# Patient Record
Sex: Male | Born: 1950 | Race: White | Hispanic: No | Marital: Married | Smoking: Former smoker
Health system: Southern US, Community
[De-identification: ages and names within clinical notes are randomized; demographics above are authoritative.]

## PROBLEM LIST (undated history)

## (undated) DIAGNOSIS — M1990 Unspecified osteoarthritis, unspecified site: Secondary | ICD-10-CM

## (undated) DIAGNOSIS — K219 Gastro-esophageal reflux disease without esophagitis: Secondary | ICD-10-CM

## (undated) DIAGNOSIS — F32A Depression, unspecified: Secondary | ICD-10-CM

## (undated) DIAGNOSIS — Z973 Presence of spectacles and contact lenses: Secondary | ICD-10-CM

## (undated) DIAGNOSIS — F329 Major depressive disorder, single episode, unspecified: Secondary | ICD-10-CM

## (undated) DIAGNOSIS — E785 Hyperlipidemia, unspecified: Secondary | ICD-10-CM

## (undated) HISTORY — PX: COLONOSCOPY: SHX174

## (undated) HISTORY — PX: TONSILLECTOMY: SUR1361

## (undated) HISTORY — PX: WISDOM TOOTH EXTRACTION: SHX21

---

## 2004-11-22 ENCOUNTER — Ambulatory Visit (HOSPITAL_COMMUNITY): Admission: RE | Admit: 2004-11-22 | Discharge: 2004-11-22 | Payer: Self-pay | Admitting: Gastroenterology

## 2005-09-03 ENCOUNTER — Encounter: Admission: RE | Admit: 2005-09-03 | Discharge: 2005-12-02 | Payer: Self-pay | Admitting: Internal Medicine

## 2014-07-05 ENCOUNTER — Encounter (HOSPITAL_BASED_OUTPATIENT_CLINIC_OR_DEPARTMENT_OTHER): Payer: Self-pay | Admitting: *Deleted

## 2014-07-05 NOTE — Progress Notes (Signed)
No labs needed

## 2014-07-06 ENCOUNTER — Other Ambulatory Visit: Payer: Self-pay | Admitting: Physician Assistant

## 2014-07-06 NOTE — H&P (Signed)
Kevin Porter is an 63 y.o. male.   Chief Complaint: right knee medial meniscus tear HPI: Kevin Porter is a 63-year-old patient seen in our outpatient office for evaluation of right knee problems that began on 05/11/2014.  He was stretching prior to a golf match when he felt something give anterior and medial in his right knee.  He felt as if something had separated.  He was unable to continue the golf match.  Since then, he has had persistent pain mostly with bending the knee or twisting.  It still feels that something comes and goes out of place.  He has had some mild swelling.  No locking.  We elected to obtain MRI which showed medial meniscus tear.    Past Medical History  Diagnosis Date  . Hyperlipemia   . GERD (gastroesophageal reflux disease)   . Arthritis   . Depression   . Wears glasses     Past Surgical History  Procedure Laterality Date  . Tonsillectomy    . Wisdom tooth extraction    . Colonoscopy      No family history on file. Social History:  reports that he quit smoking about 30 years ago. He does not have any smokeless tobacco history on file. He reports that he drinks alcohol. His drug history is not on file.  Allergies: No Known Allergies   (Not in a hospital admission)  No results found for this or any previous visit (from the past 48 hour(s)). No results found.  Review of Systems  Constitutional: Negative.   HENT: Negative for congestion, ear discharge, ear pain, hearing loss, nosebleeds, sore throat and tinnitus.   Eyes: Negative.   Respiratory: Negative.  Negative for stridor.   Cardiovascular: Negative.   Gastrointestinal: Negative.   Genitourinary: Negative.   Musculoskeletal: Positive for joint pain.  Skin: Negative.   Neurological: Positive for headaches. Negative for dizziness, tingling, tremors, sensory change, speech change, focal weakness, seizures and loss of consciousness.  Endo/Heme/Allergies: Negative.   Psychiatric/Behavioral: Positive  for depression. Negative for suicidal ideas, hallucinations, memory loss and substance abuse. The patient is not nervous/anxious and does not have insomnia.     There were no vitals taken for this visit. Physical Exam  Constitutional: He is oriented to person, place, and time. He appears well-developed and well-nourished. No distress.  HENT:  Head: Normocephalic and atraumatic.  Eyes: Conjunctivae and EOM are normal.  Neck: Normal range of motion. Neck supple.  Cardiovascular: Normal rate, regular rhythm and intact distal pulses.   Respiratory: Effort normal. No respiratory distress.  GI: Soft. He exhibits no distension. There is no tenderness.  Musculoskeletal:       Right knee: He exhibits swelling and bony tenderness. He exhibits no ecchymosis, no deformity, no laceration and no erythema. Tenderness found. Medial joint line tenderness noted.  Neurological: He is alert and oriented to person, place, and time.  Skin: Skin is warm and dry. No rash noted. No erythema.  Psychiatric: He has a normal mood and affect. His behavior is normal.     Assessment/Plan Right knee medial meniscus tear  We discussed risks and benefits of right knee arthroscopy medial menisectomy, possible debridement chondroplasty patient wishes to proceed.  This will be set up as outpatient surgery.  Oliviya Gilkison 07/06/2014, 5:24 PM    

## 2014-07-07 ENCOUNTER — Encounter (HOSPITAL_BASED_OUTPATIENT_CLINIC_OR_DEPARTMENT_OTHER)
Admission: RE | Disposition: A | Discharge status: Home / Self Care | Payer: Self-pay | Source: Ambulatory Visit | Attending: Orthopedic Surgery

## 2014-07-07 ENCOUNTER — Encounter (HOSPITAL_BASED_OUTPATIENT_CLINIC_OR_DEPARTMENT_OTHER): Payer: 59 | Admitting: Certified Registered"

## 2014-07-07 ENCOUNTER — Ambulatory Visit (HOSPITAL_BASED_OUTPATIENT_CLINIC_OR_DEPARTMENT_OTHER)
Admission: RE | Admit: 2014-07-07 | Discharge: 2014-07-07 | Disposition: A | Discharge status: Home / Self Care | Payer: 59 | Source: Ambulatory Visit | Attending: Orthopedic Surgery | Admitting: Orthopedic Surgery

## 2014-07-07 ENCOUNTER — Ambulatory Visit (HOSPITAL_BASED_OUTPATIENT_CLINIC_OR_DEPARTMENT_OTHER): Payer: 59 | Admitting: Certified Registered"

## 2014-07-07 ENCOUNTER — Encounter (HOSPITAL_BASED_OUTPATIENT_CLINIC_OR_DEPARTMENT_OTHER): Payer: Self-pay | Admitting: Certified Registered"

## 2014-07-07 DIAGNOSIS — M129 Arthropathy, unspecified: Secondary | ICD-10-CM | POA: Insufficient documentation

## 2014-07-07 DIAGNOSIS — IMO0002 Reserved for concepts with insufficient information to code with codable children: Secondary | ICD-10-CM | POA: Insufficient documentation

## 2014-07-07 DIAGNOSIS — K219 Gastro-esophageal reflux disease without esophagitis: Secondary | ICD-10-CM | POA: Insufficient documentation

## 2014-07-07 DIAGNOSIS — X500XXA Overexertion from strenuous movement or load, initial encounter: Secondary | ICD-10-CM | POA: Insufficient documentation

## 2014-07-07 DIAGNOSIS — F329 Major depressive disorder, single episode, unspecified: Secondary | ICD-10-CM | POA: Insufficient documentation

## 2014-07-07 DIAGNOSIS — Z87891 Personal history of nicotine dependence: Secondary | ICD-10-CM | POA: Insufficient documentation

## 2014-07-07 DIAGNOSIS — Y9353 Activity, golf: Secondary | ICD-10-CM | POA: Insufficient documentation

## 2014-07-07 DIAGNOSIS — F3289 Other specified depressive episodes: Secondary | ICD-10-CM | POA: Insufficient documentation

## 2014-07-07 DIAGNOSIS — E785 Hyperlipidemia, unspecified: Secondary | ICD-10-CM | POA: Insufficient documentation

## 2014-07-07 DIAGNOSIS — F1010 Alcohol abuse, uncomplicated: Secondary | ICD-10-CM | POA: Insufficient documentation

## 2014-07-07 HISTORY — DX: Presence of spectacles and contact lenses: Z97.3

## 2014-07-07 HISTORY — DX: Major depressive disorder, single episode, unspecified: F32.9

## 2014-07-07 HISTORY — DX: Unspecified osteoarthritis, unspecified site: M19.90

## 2014-07-07 HISTORY — DX: Hyperlipidemia, unspecified: E78.5

## 2014-07-07 HISTORY — DX: Depression, unspecified: F32.A

## 2014-07-07 HISTORY — PX: KNEE ARTHROSCOPY WITH MEDIAL MENISECTOMY: SHX5651

## 2014-07-07 HISTORY — DX: Gastro-esophageal reflux disease without esophagitis: K21.9

## 2014-07-07 LAB — POCT HEMOGLOBIN-HEMACUE: HEMOGLOBIN: 14.9 g/dL (ref 13.0–17.0)

## 2014-07-07 SURGERY — ARTHROSCOPY, KNEE, WITH MEDIAL MENISCECTOMY
Anesthesia: General | Site: Knee | Laterality: Right

## 2014-07-07 MED ORDER — BUPIVACAINE HCL 0.5 % IJ SOLN
INTRAMUSCULAR | Status: DC | PRN
  Administered 2014-07-07: 30 mL via INTRA_ARTICULAR

## 2014-07-07 MED ORDER — MORPHINE SULFATE 4 MG/ML IJ SOLN
INTRAMUSCULAR | Status: DC | PRN
  Administered 2014-07-07: 4 mg via INTRAVENOUS

## 2014-07-07 MED ORDER — BUPIVACAINE HCL (PF) 0.5 % IJ SOLN
INTRAMUSCULAR | Status: AC
  Filled 2014-07-07: qty 60

## 2014-07-07 MED ORDER — SODIUM CHLORIDE 0.9 % IR SOLN
Status: DC | PRN
  Administered 2014-07-07: 1

## 2014-07-07 MED ORDER — LIDOCAINE HCL (CARDIAC) 20 MG/ML IV SOLN
INTRAVENOUS | Status: DC | PRN
  Administered 2014-07-07: 30 mg via INTRAVENOUS

## 2014-07-07 MED ORDER — FENTANYL CITRATE 0.05 MG/ML IJ SOLN
INTRAMUSCULAR | Status: AC
  Filled 2014-07-07: qty 6

## 2014-07-07 MED ORDER — HYDROCODONE-ACETAMINOPHEN 7.5-325 MG PO TABS: 1.0000 | ORAL_TABLET | ORAL | Status: DC | PRN

## 2014-07-07 MED ORDER — EPINEPHRINE HCL 1 MG/ML IJ SOLN
INTRAMUSCULAR | Status: AC
  Filled 2014-07-07: qty 1

## 2014-07-07 MED ORDER — MIDAZOLAM HCL 2 MG/2ML IJ SOLN
INTRAMUSCULAR | Status: AC
  Filled 2014-07-07: qty 2

## 2014-07-07 MED ORDER — HYDROCODONE-ACETAMINOPHEN 5-325 MG PO TABS
1.0000 | ORAL_TABLET | Freq: Four times a day (QID) | ORAL | Status: DC | PRN
  Administered 2014-07-07: 1 via ORAL

## 2014-07-07 MED ORDER — ONDANSETRON HCL 4 MG/2ML IJ SOLN: 4.0000 mg | Freq: Four times a day (QID) | INTRAMUSCULAR | Status: DC | PRN

## 2014-07-07 MED ORDER — PROPOFOL 10 MG/ML IV BOLUS
INTRAVENOUS | Status: DC | PRN
  Administered 2014-07-07: 160 mg via INTRAVENOUS

## 2014-07-07 MED ORDER — SODIUM CHLORIDE 0.9 % IV SOLN: INTRAVENOUS | Status: DC

## 2014-07-07 MED ORDER — CHLORHEXIDINE GLUCONATE 4 % EX LIQD: 60.0000 mL | Freq: Once | CUTANEOUS | Status: DC

## 2014-07-07 MED ORDER — BUPIVACAINE-EPINEPHRINE (PF) 0.5% -1:200000 IJ SOLN
INTRAMUSCULAR | Status: AC
  Filled 2014-07-07: qty 30

## 2014-07-07 MED ORDER — HYDROMORPHONE HCL PF 1 MG/ML IJ SOLN: 0.5000 mg | INTRAMUSCULAR | Status: DC | PRN

## 2014-07-07 MED ORDER — FENTANYL CITRATE 0.05 MG/ML IJ SOLN
INTRAMUSCULAR | Status: DC | PRN
  Administered 2014-07-07: 50 ug via INTRAVENOUS

## 2014-07-07 MED ORDER — DEXAMETHASONE SODIUM PHOSPHATE 4 MG/ML IJ SOLN
INTRAMUSCULAR | Status: DC | PRN
  Administered 2014-07-07: 10 mg via INTRAVENOUS

## 2014-07-07 MED ORDER — LACTATED RINGERS IV SOLN
INTRAVENOUS | Status: DC
Start: 2014-07-07 — End: 2014-07-07
  Administered 2014-07-07 (×2): via INTRAVENOUS

## 2014-07-07 MED ORDER — HYDROCODONE-ACETAMINOPHEN 5-325 MG PO TABS
ORAL_TABLET | ORAL | Status: AC
  Filled 2014-07-07: qty 1

## 2014-07-07 MED ORDER — DOCUSATE SODIUM 100 MG PO CAPS: 100.0000 mg | ORAL_CAPSULE | Freq: Two times a day (BID) | ORAL | Status: DC

## 2014-07-07 MED ORDER — ONDANSETRON HCL 4 MG/2ML IJ SOLN
INTRAMUSCULAR | Status: DC | PRN
  Administered 2014-07-07: 4 mg via INTRAVENOUS

## 2014-07-07 MED ORDER — FENTANYL CITRATE 0.05 MG/ML IJ SOLN: 50.0000 ug | INTRAMUSCULAR | Status: DC | PRN

## 2014-07-07 MED ORDER — ONDANSETRON HCL 4 MG/2ML IJ SOLN: 4.0000 mg | Freq: Once | INTRAMUSCULAR | Status: DC | PRN

## 2014-07-07 MED ORDER — METOCLOPRAMIDE HCL 5 MG PO TABS: 5.0000 mg | ORAL_TABLET | Freq: Three times a day (TID) | ORAL | Status: DC | PRN

## 2014-07-07 MED ORDER — MORPHINE SULFATE 2 MG/ML IJ SOLN
INTRAMUSCULAR | Status: AC
  Filled 2014-07-07: qty 2

## 2014-07-07 MED ORDER — METHYLPREDNISOLONE ACETATE 80 MG/ML IJ SUSP
INTRAMUSCULAR | Status: AC
  Filled 2014-07-07: qty 1

## 2014-07-07 MED ORDER — HYDROMORPHONE HCL PF 1 MG/ML IJ SOLN
0.2500 mg | INTRAMUSCULAR | Status: DC | PRN
  Administered 2014-07-07: 11:00:00 via INTRAVENOUS
  Administered 2014-07-07: 0.5 mg via INTRAVENOUS

## 2014-07-07 MED ORDER — MIDAZOLAM HCL 2 MG/2ML IJ SOLN: 1.0000 mg | INTRAMUSCULAR | Status: DC | PRN

## 2014-07-07 MED ORDER — HYDROMORPHONE HCL PF 1 MG/ML IJ SOLN
INTRAMUSCULAR | Status: AC
  Filled 2014-07-07: qty 1

## 2014-07-07 MED ORDER — EPINEPHRINE HCL 1 MG/ML IJ SOLN
INTRAMUSCULAR | Status: DC | PRN
  Administered 2014-07-07: 1 mg via SUBCUTANEOUS

## 2014-07-07 MED ORDER — ONDANSETRON HCL 4 MG PO TABS: 4.0000 mg | ORAL_TABLET | Freq: Four times a day (QID) | ORAL | Status: DC | PRN

## 2014-07-07 MED ORDER — CEFAZOLIN SODIUM-DEXTROSE 2-3 GM-% IV SOLR
2.0000 g | INTRAVENOUS | Status: AC
  Administered 2014-07-07: 2 g via INTRAVENOUS

## 2014-07-07 MED ORDER — METOCLOPRAMIDE HCL 5 MG/ML IJ SOLN: 5.0000 mg | Freq: Three times a day (TID) | INTRAMUSCULAR | Status: DC | PRN

## 2014-07-07 MED ORDER — CEFAZOLIN SODIUM-DEXTROSE 2-3 GM-% IV SOLR
INTRAVENOUS | Status: AC
  Filled 2014-07-07: qty 50

## 2014-07-07 MED ORDER — MIDAZOLAM HCL 5 MG/5ML IJ SOLN
INTRAMUSCULAR | Status: DC | PRN
  Administered 2014-07-07: 2 mg via INTRAVENOUS

## 2014-07-07 MED ORDER — METHYLPREDNISOLONE ACETATE 40 MG/ML IJ SUSP
INTRAMUSCULAR | Status: AC
  Filled 2014-07-07: qty 1

## 2014-07-07 SURGICAL SUPPLY — 45 items
BANDAGE ELASTIC 6 VELCRO ST LF (GAUZE/BANDAGES/DRESSINGS) IMPLANT
BANDAGE ESMARK 6X9 LF (GAUZE/BANDAGES/DRESSINGS) IMPLANT
BLADE 4.2CUDA (BLADE) IMPLANT
BLADE CUDA 5.5 (BLADE) IMPLANT
BLADE CUDA GRT WHITE 3.5 (BLADE) ×2 IMPLANT
BLADE CUDA SHAVER 3.5 (BLADE) IMPLANT
BLADE CUTTER MENIS 5.5 (BLADE) IMPLANT
BLADE GREAT WHITE 4.2 (BLADE) IMPLANT
BLADE GREAT WHITE 4.2MM (BLADE)
BNDG CMPR 9X6 STRL LF SNTH (GAUZE/BANDAGES/DRESSINGS)
BNDG ESMARK 6X9 LF (GAUZE/BANDAGES/DRESSINGS)
BNDG GAUZE ELAST 4 BULKY (GAUZE/BANDAGES/DRESSINGS) ×3 IMPLANT
BRUSH SCRUB EZ PLAIN DRY (MISCELLANEOUS) ×1 IMPLANT
CANISTER SUCT 3000ML (MISCELLANEOUS) IMPLANT
CUTTER MENISCUS  4.2MM (BLADE)
CUTTER MENISCUS 4.2MM (BLADE) IMPLANT
DRAPE ARTHROSCOPY W/POUCH 114 (DRAPES) ×3 IMPLANT
DRSG EMULSION OIL 3X3 NADH (GAUZE/BANDAGES/DRESSINGS) ×3 IMPLANT
DURAPREP 26ML APPLICATOR (WOUND CARE) ×3 IMPLANT
GAUZE SPONGE 4X4 12PLY STRL (GAUZE/BANDAGES/DRESSINGS) ×3 IMPLANT
GLOVE BIO SURGEON STRL SZ7.5 (GLOVE) ×3 IMPLANT
GLOVE BIOGEL M STRL SZ7.5 (GLOVE) ×2 IMPLANT
GLOVE BIOGEL PI IND STRL 8 (GLOVE) ×2 IMPLANT
GLOVE BIOGEL PI INDICATOR 8 (GLOVE) ×6
GLOVE SURG ORTHO 8.0 STRL STRW (GLOVE) ×3 IMPLANT
GOWN STRL REUS W/ TWL LRG LVL3 (GOWN DISPOSABLE) IMPLANT
GOWN STRL REUS W/ TWL XL LVL3 (GOWN DISPOSABLE) ×1 IMPLANT
GOWN STRL REUS W/TWL LRG LVL3 (GOWN DISPOSABLE)
GOWN STRL REUS W/TWL XL LVL3 (GOWN DISPOSABLE) ×6
HOLDER KNEE FOAM BLUE (MISCELLANEOUS) ×3 IMPLANT
KNEE WRAP E Z 3 GEL PACK (MISCELLANEOUS) ×2 IMPLANT
MANIFOLD NEPTUNE II (INSTRUMENTS) ×2 IMPLANT
NDL SAFETY ECLIPSE 18X1.5 (NEEDLE) ×1 IMPLANT
NEEDLE HYPO 18GX1.5 SHARP (NEEDLE)
PACK ARTHROSCOPY DSU (CUSTOM PROCEDURE TRAY) ×3 IMPLANT
PACK BASIN DAY SURGERY FS (CUSTOM PROCEDURE TRAY) ×3 IMPLANT
SET ARTHROSCOPY TUBING (MISCELLANEOUS) ×3
SET ARTHROSCOPY TUBING LN (MISCELLANEOUS) ×1 IMPLANT
SUT ETHILON 4 0 PS 2 18 (SUTURE) ×3 IMPLANT
SYRINGE 6CC (MISCELLANEOUS) ×3 IMPLANT
TOWEL OR 17X24 6PK STRL BLUE (TOWEL DISPOSABLE) ×3 IMPLANT
WAND 3.0 CAPSURE 30 DEG W/CORD (SURGICAL WAND) IMPLANT
WAND 30 DEG SABER W/CORD (SURGICAL WAND) IMPLANT
WAND STAR VAC 90 (SURGICAL WAND) IMPLANT
WATER STERILE IRR 1000ML POUR (IV SOLUTION) ×3 IMPLANT

## 2014-07-07 NOTE — H&P (View-Only) (Signed)
Kevin Porter is an 63 y.o. male.   Chief Complaint: right knee medial meniscus tear HPI: Mr. Kevin Porter is a 63 year old patient seen in our outpatient office for evaluation of right knee problems that began on 05/11/2014.  He was stretching prior to a golf match when he felt something give anterior and medial in his right knee.  He felt as if something had separated.  He was unable to continue the golf match.  Since then, he has had persistent pain mostly with bending the knee or twisting.  It still feels that something comes and goes out of place.  He has had some mild swelling.  No locking.  We elected to obtain MRI which showed medial meniscus tear.    Past Medical History  Diagnosis Date  . Hyperlipemia   . GERD (gastroesophageal reflux disease)   . Arthritis   . Depression   . Wears glasses     Past Surgical History  Procedure Laterality Date  . Tonsillectomy    . Wisdom tooth extraction    . Colonoscopy      No family history on file. Social History:  reports that he quit smoking about 30 years ago. He does not have any smokeless tobacco history on file. He reports that he drinks alcohol. His drug history is not on file.  Allergies: No Known Allergies   (Not in a hospital admission)  No results found for this or any previous visit (from the past 48 hour(s)). No results found.  Review of Systems  Constitutional: Negative.   HENT: Negative for congestion, ear discharge, ear pain, hearing loss, nosebleeds, sore throat and tinnitus.   Eyes: Negative.   Respiratory: Negative.  Negative for stridor.   Cardiovascular: Negative.   Gastrointestinal: Negative.   Genitourinary: Negative.   Musculoskeletal: Positive for joint pain.  Skin: Negative.   Neurological: Positive for headaches. Negative for dizziness, tingling, tremors, sensory change, speech change, focal weakness, seizures and loss of consciousness.  Endo/Heme/Allergies: Negative.   Psychiatric/Behavioral: Positive  for depression. Negative for suicidal ideas, hallucinations, memory loss and substance abuse. The patient is not nervous/anxious and does not have insomnia.     There were no vitals taken for this visit. Physical Exam  Constitutional: He is oriented to person, place, and time. He appears well-developed and well-nourished. No distress.  HENT:  Head: Normocephalic and atraumatic.  Eyes: Conjunctivae and EOM are normal.  Neck: Normal range of motion. Neck supple.  Cardiovascular: Normal rate, regular rhythm and intact distal pulses.   Respiratory: Effort normal. No respiratory distress.  GI: Soft. He exhibits no distension. There is no tenderness.  Musculoskeletal:       Right knee: He exhibits swelling and bony tenderness. He exhibits no ecchymosis, no deformity, no laceration and no erythema. Tenderness found. Medial joint line tenderness noted.  Neurological: He is alert and oriented to person, place, and time.  Skin: Skin is warm and dry. No rash noted. No erythema.  Psychiatric: He has a normal mood and affect. His behavior is normal.     Assessment/Plan Right knee medial meniscus tear  We discussed risks and benefits of right knee arthroscopy medial menisectomy, possible debridement chondroplasty patient wishes to proceed.  This will be set up as outpatient surgery.  Margart SicklesChadwell, Paulette Lynch 07/06/2014, 5:24 PM

## 2014-07-07 NOTE — Anesthesia Postprocedure Evaluation (Signed)
  Anesthesia Post-op Note  Patient: Kevin Porter  Procedure(s) Performed: Procedure(s): RIGHT KNEE ARTHROSCOPY WITH MEDIAL MENISECTOMY (Right)  Patient Location: PACU  Anesthesia Type:General  Level of Consciousness: awake, alert  and oriented  Airway and Oxygen Therapy: Patient Spontanous Breathing  Post-op Pain: mild  Post-op Assessment: Post-op Vital signs reviewed, Patient's Cardiovascular Status Stable, Respiratory Function Stable, Patent Airway and Pain level controlled  Post-op Vital Signs: stable  Last Vitals:  Filed Vitals:   07/07/14 1215  BP: 138/89  Pulse: 75  Temp: 36.6 C  Resp: 16    Complications: No apparent anesthesia complications

## 2014-07-07 NOTE — Anesthesia Preprocedure Evaluation (Addendum)
Anesthesia Evaluation  Patient identified by MRN, date of birth, ID band Patient awake    Reviewed: Allergy & Precautions, H&P , NPO status   Airway Mallampati: II  Neck ROM: Full    Dental  (+) Teeth Intact   Pulmonary former smoker,  breath sounds clear to auscultation        Cardiovascular Rhythm:Regular Rate:Normal     Neuro/Psych    GI/Hepatic   Endo/Other    Renal/GU      Musculoskeletal   Abdominal   Peds  Hematology   Anesthesia Other Findings   Reproductive/Obstetrics                          Anesthesia Physical Anesthesia Plan  ASA: II  Anesthesia Plan: General   Post-op Pain Management:    Induction: Intravenous  Airway Management Planned: LMA  Additional Equipment:   Intra-op Plan:   Post-operative Plan:   Informed Consent: I have reviewed the patients History and Physical, chart, labs and discussed the procedure including the risks, benefits and alternatives for the proposed anesthesia with the patient or authorized representative who has indicated his/her understanding and acceptance.   Dental advisory given  Plan Discussed with: CRNA and Anesthesiologist  Anesthesia Plan Comments: (MMT R. Knee GERD  Plan GA with LMA  Kipp Broodavid Joslin)       Anesthesia Quick Evaluation

## 2014-07-07 NOTE — Interval H&P Note (Signed)
History and Physical Interval Note:  07/07/2014 9:37 AM  Kevin Porter  has presented today for surgery, with the diagnosis of Mensical tear of right knee  The various methods of treatment have been discussed with the patient and family. After consideration of risks, benefits and other options for treatment, the patient has consented to  Procedure(s): RIGHT KNEE ARTHROSCOPY WITH MEDIAL MENISECTOMY (Right) as a surgical intervention .  The patient's history has been reviewed, patient examined, no change in status, stable for surgery.  I have reviewed the patient's chart and labs.  Questions were answered to the patient's satisfaction.     Maleaha Hughett JR,W D

## 2014-07-07 NOTE — Transfer of Care (Signed)
Immediate Anesthesia Transfer of Care Note  Patient: Kevin Porter  Procedure(s) Performed: Procedure(s): RIGHT KNEE ARTHROSCOPY WITH MEDIAL MENISECTOMY (Right)  Patient Location: PACU  Anesthesia Type:General  Level of Consciousness: awake and patient cooperative  Airway & Oxygen Therapy: Patient Spontanous Breathing and Patient connected to face mask oxygen  Post-op Assessment: Report given to PACU RN and Post -op Vital signs reviewed and stable  Post vital signs: Reviewed and stable  Complications: No apparent anesthesia complications

## 2014-07-07 NOTE — Anesthesia Procedure Notes (Signed)
Procedure Name: LMA Insertion Date/Time: 07/07/2014 9:51 AM Performed by: Tanay Misuraca Pre-anesthesia Checklist: Patient identified, Emergency Drugs available, Suction available and Patient being monitored Patient Re-evaluated:Patient Re-evaluated prior to inductionOxygen Delivery Method: Circle System Utilized Preoxygenation: Pre-oxygenation with 100% oxygen Intubation Type: IV induction Ventilation: Mask ventilation without difficulty LMA: LMA inserted LMA Size: 4.0 Number of attempts: 1 Airway Equipment and Method: bite block Placement Confirmation: positive ETCO2 Tube secured with: Tape Dental Injury: Teeth and Oropharynx as per pre-operative assessment

## 2014-07-07 NOTE — Discharge Instructions (Signed)
Diet: As you were doing prior to hospitalization   Activity: Increase activity slowly as tolerated  No lifting or driving for [employer]1weeks   Shower: May shower without a dressing on post op day #2 NO SOAKING in tub   Dressing: You may change your dressing on post op day #2  Then change daily band aids  Weight Bearing: weight bearing as tolerated  To prevent constipation: you may use a stool softener such as -  Colace ( over the counter) 100 mg by mouth twice a day  Drink plenty of fluids ( prune juice may be helpful) and high fiber foods  Miralax ( over the counter) for constipation as needed.   Precautions: If you experience chest pain or shortness of breath - call 911 immediately For transfer to the hospital emergency department!!  If you develop a fever greater that 101 F, purulent drainage from wound, increased redness or drainage from wound, or calf pain -- Call the office   Follow- Up Appointment: Please call for an appointment to be seen in 1 weeks  [place] [phone]    Post Anesthesia Home Care Instructions  Activity: Get plenty of rest for the remainder of the day. A responsible adult should stay with you for 24 hours following the procedure.  For the next 24 hours, DO NOT: -Drive a car -Advertising copywriterperate machinery -Drink alcoholic beverages -Take any medication unless instructed by your physician -Make any legal decisions or sign important papers.  Meals: Start with liquid foods such as gelatin or soup. Progress to regular foods as tolerated. Avoid greasy, spicy, heavy foods. If nausea and/or vomiting occur, drink only clear liquids until the nausea and/or vomiting subsides. Call your physician if vomiting continues.  Special Instructions/Symptoms: Your throat may feel dry or sore from the anesthesia or the breathing tube placed in your throat during surgery. If this causes discomfort, gargle with warm salt water. The discomfort should disappear within 24 hours.

## 2014-07-10 ENCOUNTER — Encounter (HOSPITAL_BASED_OUTPATIENT_CLINIC_OR_DEPARTMENT_OTHER): Payer: Self-pay | Admitting: Orthopedic Surgery

## 2014-07-10 NOTE — Op Note (Signed)
NAME:  Kevin Porter, Kevin Porter              ACCOUNT NO.:  [id number]  MEDICAL RECORD NO.:  [id number]  LOCATION:                                 FACILITY:  PHYSICIAN:  Dyke BrackettW. D. Erie Radu, M.D.    DATE OF BIRTH:  [birth year]  DATE OF PROCEDURE:  07/07/2014 DATE OF DISCHARGE:  07/07/2014                              OPERATIVE REPORT   PREOPERATIVE DIAGNOSIS:  Right torn medial meniscus.  POSTOPERATIVE DIAGNOSIS:  Right torn medial meniscus.  OPERATION:  Partial medial meniscectomy (arthroscopic), right knee.  SURGEON:  Dyke BrackettW. D. Cheria Sadiq, M.D.  ANESTHESIA:  General with local supplementation.  DESCRIPTION OF PROCEDURE:  Arthroscopic portals were created __________ inferolaterally.  Systematic inspection of the knee showed the patient to have a normal patellofemoral joint lateral compartment, lateral meniscus, ACL, PCL, complex tear, posterior horn meniscus, most resection of most of the posterior horn back to the capsule.  Estimated about 40% of meniscus was excised.  No significant chondral irregularity was noted.  Knee drained free of fluid.  Portals were closed with nylon, infiltrated the joint with Marcaine and morphine with additional Marcaine for total 30 mL of Marcaine and 0.5% plain and 4 mg of morphine into the joint.  Lightly compressive sterile dressing applied, taken to recovery room in stable condition.     Dyke BrackettW. D. Jaysin Gayler, M.D.     WDC/MEDQ  D:  07/07/2014  T:  07/07/2014  Job:  [id number]

## 2015-05-30 ENCOUNTER — Ambulatory Visit (INDEPENDENT_AMBULATORY_CARE_PROVIDER_SITE_OTHER): Payer: 59 | Admitting: Sports Medicine

## 2015-05-30 VITALS — BP 128/82 | HR 75 | Temp 99.0°F | Resp 17 | Ht 71.5 in | Wt 197.0 lb

## 2015-05-30 DIAGNOSIS — J0121 Acute recurrent ethmoidal sinusitis: Secondary | ICD-10-CM | POA: Diagnosis not present

## 2015-05-30 DIAGNOSIS — H6093 Unspecified otitis externa, bilateral: Secondary | ICD-10-CM

## 2015-05-30 DIAGNOSIS — J322 Chronic ethmoidal sinusitis: Secondary | ICD-10-CM | POA: Insufficient documentation

## 2015-05-30 DIAGNOSIS — H60509 Unspecified acute noninfective otitis externa, unspecified ear: Secondary | ICD-10-CM | POA: Insufficient documentation

## 2015-05-30 MED ORDER — PREDNISONE 20 MG PO TABS: 20.0000 mg | ORAL_TABLET | Freq: Two times a day (BID) | ORAL | Status: AC

## 2015-05-30 MED ORDER — DOXYCYCLINE HYCLATE 100 MG PO CAPS: 100.0000 mg | ORAL_CAPSULE | Freq: Two times a day (BID) | ORAL | Status: AC

## 2015-05-30 MED ORDER — CIPROFLOXACIN-DEXAMETHASONE 0.3-0.1 % OT SUSP: 4.0000 [drp] | Freq: Two times a day (BID) | OTIC | Status: AC

## 2015-05-30 NOTE — Progress Notes (Signed)
  Kevin Porter - 64 y.o. male MRN [id number]  Date of birth: 12-Oct-1951  SUBJECTIVE:  Including CC & ROS.  Onset of symptoms: 3-4 weeks recent treated with ZpaK Symptoms include: yes Nasal congestion, no nasal drainage , color of drainage is no, no sore throat, no post nasal drip.  yes fullness in the ears , yes sinus pressure, yes start Sunday headache, no fever, no chills, no bodyache, increased fatigue,  some cough, no mucous, no SOB. no history of tobacco use, no history of asthma, no history of COPD, yes history of seasonal allergies treatment with nasal stray. Denies Nausea, vomiting, diarrhea.  Appetite normal, and Drinking fluids Relieving factors: recent ZpaK did not resolved symptoms and the symptoms increase again this past weekend Symptoms not improving but no worse Vital signs reviewed: Normal respirations, normal pulse ox, normal temperature, normal pulse  ROS:  Constitutional:  No fever, chills, or fatigue.  Respiratory:  No shortness of breath, some cough, no wheezing Cardiovascular:  No palpitations, chest pain or syncope Gastrointestinal:  No nausea, no abdominal pain Psych: History of depression after the loss of his sister and poor sleep quality been treated by PCP for this issue. Patient denies any alcohol abuse. Denies any suicidal homicidal ideations. Review of systems otherwise negative except for what is stated in HPI  HISTORY: Past Medical, Surgical, Social, and Family History Reviewed & Updated per EMR. Pertinent Historical Findings include: Depression anxiety History of chronic pain issues currently taking Cymbalta Hyperlipidemia Acid reflux  PHYSICAL EXAM:  VS: BP:128/82 mmHg  HR:75bpm  TEMP:99 F (37.2 C)(Oral)  RESP:96 %  HT:5' 11.5" (181.6 cm)   WT:197 lb (89.359 kg)  BMI:27.1 PHYSICAL EXAM: General:  Alert and oriented, No acute distress.   HENT:  Normocephalic, Oral mucosa is moist.  URI ENT: Eyes are equal and reactive to light, normal  conjunctivae, normal hearing, mucous membrane is moist, no erythema, no exudate.  Bilateral ears have fluid with bulging but no erythema, TMs are intact.  Both nasal passages are inflamed, erythematous, with clear drainage and inflamed turbinate.  Sinus passages are tender to palpation.  Submandibular glands are fluctuant mobile and soft. Respiratory:  Lungs are clear to auscultation, Respirations are non-labored, Symmetrical chest wall expansion.   Cardiovascular:  Normal rate, Regular rhythm, No murmur, Good pulses equal in all extremities, No edema.   Gastrointestinal:  Soft, Non-tender, Non-distended, Normal bowel sounds, No organomegaly.   Integumentary:  Warm, Dry, No rash.   Neurologic:  Alert, Oriented, No focal defects Psychiatric:  Cooperative, Appropriate mood & affect.    ASSESSMENT & PLAN:  Impression: Acute bacterial sinusitis Acute bacterial otitis externa History of underlying bronchial spasms with no acute respiratory distress.  Patient on the patient's history of persistent upper respiratory congestion, thick mucus production, evidence of moist. Drainage from bilateral ear canals, no signs of tympanic membrane erythema. Suspect patient has an upper respiratory infection with associated otitis externa. Will change patient and by therapy as the azithromycin was not sufficiently covering patient's infection. We'll treat him with doxycycline due to concern of possible underlying chronic bronchitis and asthma issues. Will treat with a course of prednisone twice a day for 5 days. Ciprodex for his ear infection. Patient will follow-up if symptoms not improved in 7-10 days.

## 2016-03-17 ENCOUNTER — Other Ambulatory Visit: Payer: Self-pay | Admitting: Internal Medicine

## 2016-03-17 ENCOUNTER — Ambulatory Visit
Admission: RE | Admit: 2016-03-17 | Discharge: 2016-03-17 | Disposition: A | Discharge status: Home / Self Care | Payer: 59 | Source: Ambulatory Visit | Attending: Internal Medicine | Admitting: Internal Medicine

## 2016-03-17 DIAGNOSIS — M25571 Pain in right ankle and joints of right foot: Secondary | ICD-10-CM

## 2016-03-17 DIAGNOSIS — M79672 Pain in left foot: Secondary | ICD-10-CM

## 2016-03-17 DIAGNOSIS — M79671 Pain in right foot: Secondary | ICD-10-CM

## 2016-08-31 IMAGING — CR DG FOOT 2V*R*
2 series · 2 of 2 positions shown · non-contrast
Comparison: Right ankle series from today reported separately.

CLINICAL DATA: 64-year-old male with right foot and ankle pain for
4-5 years with no known injury. Medial ankle and first metatarsal
great toe a region pain. Initial encounter.

EXAM:
RIGHT FOOT - 2 VIEW

[t foot ap right]
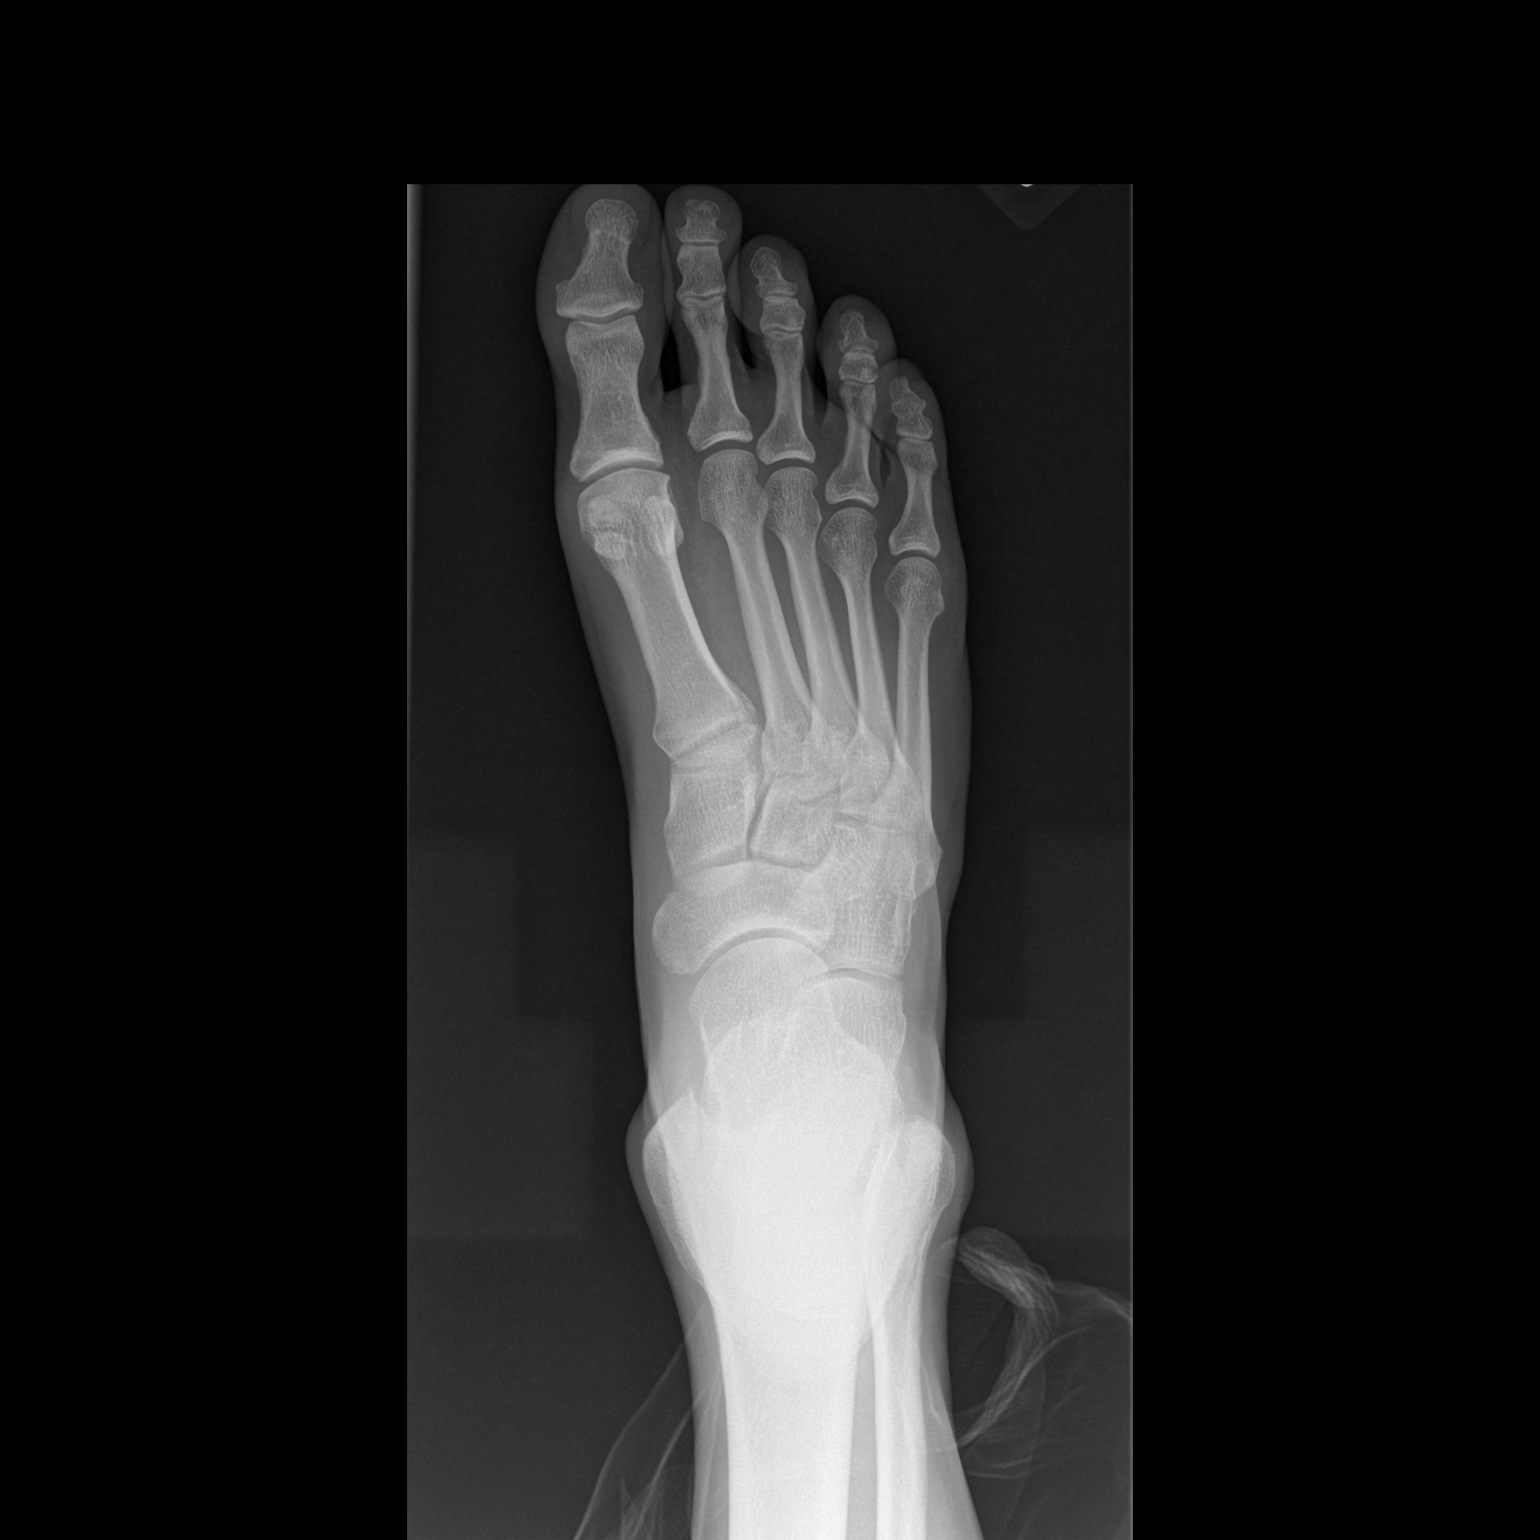

[t foot lat right]
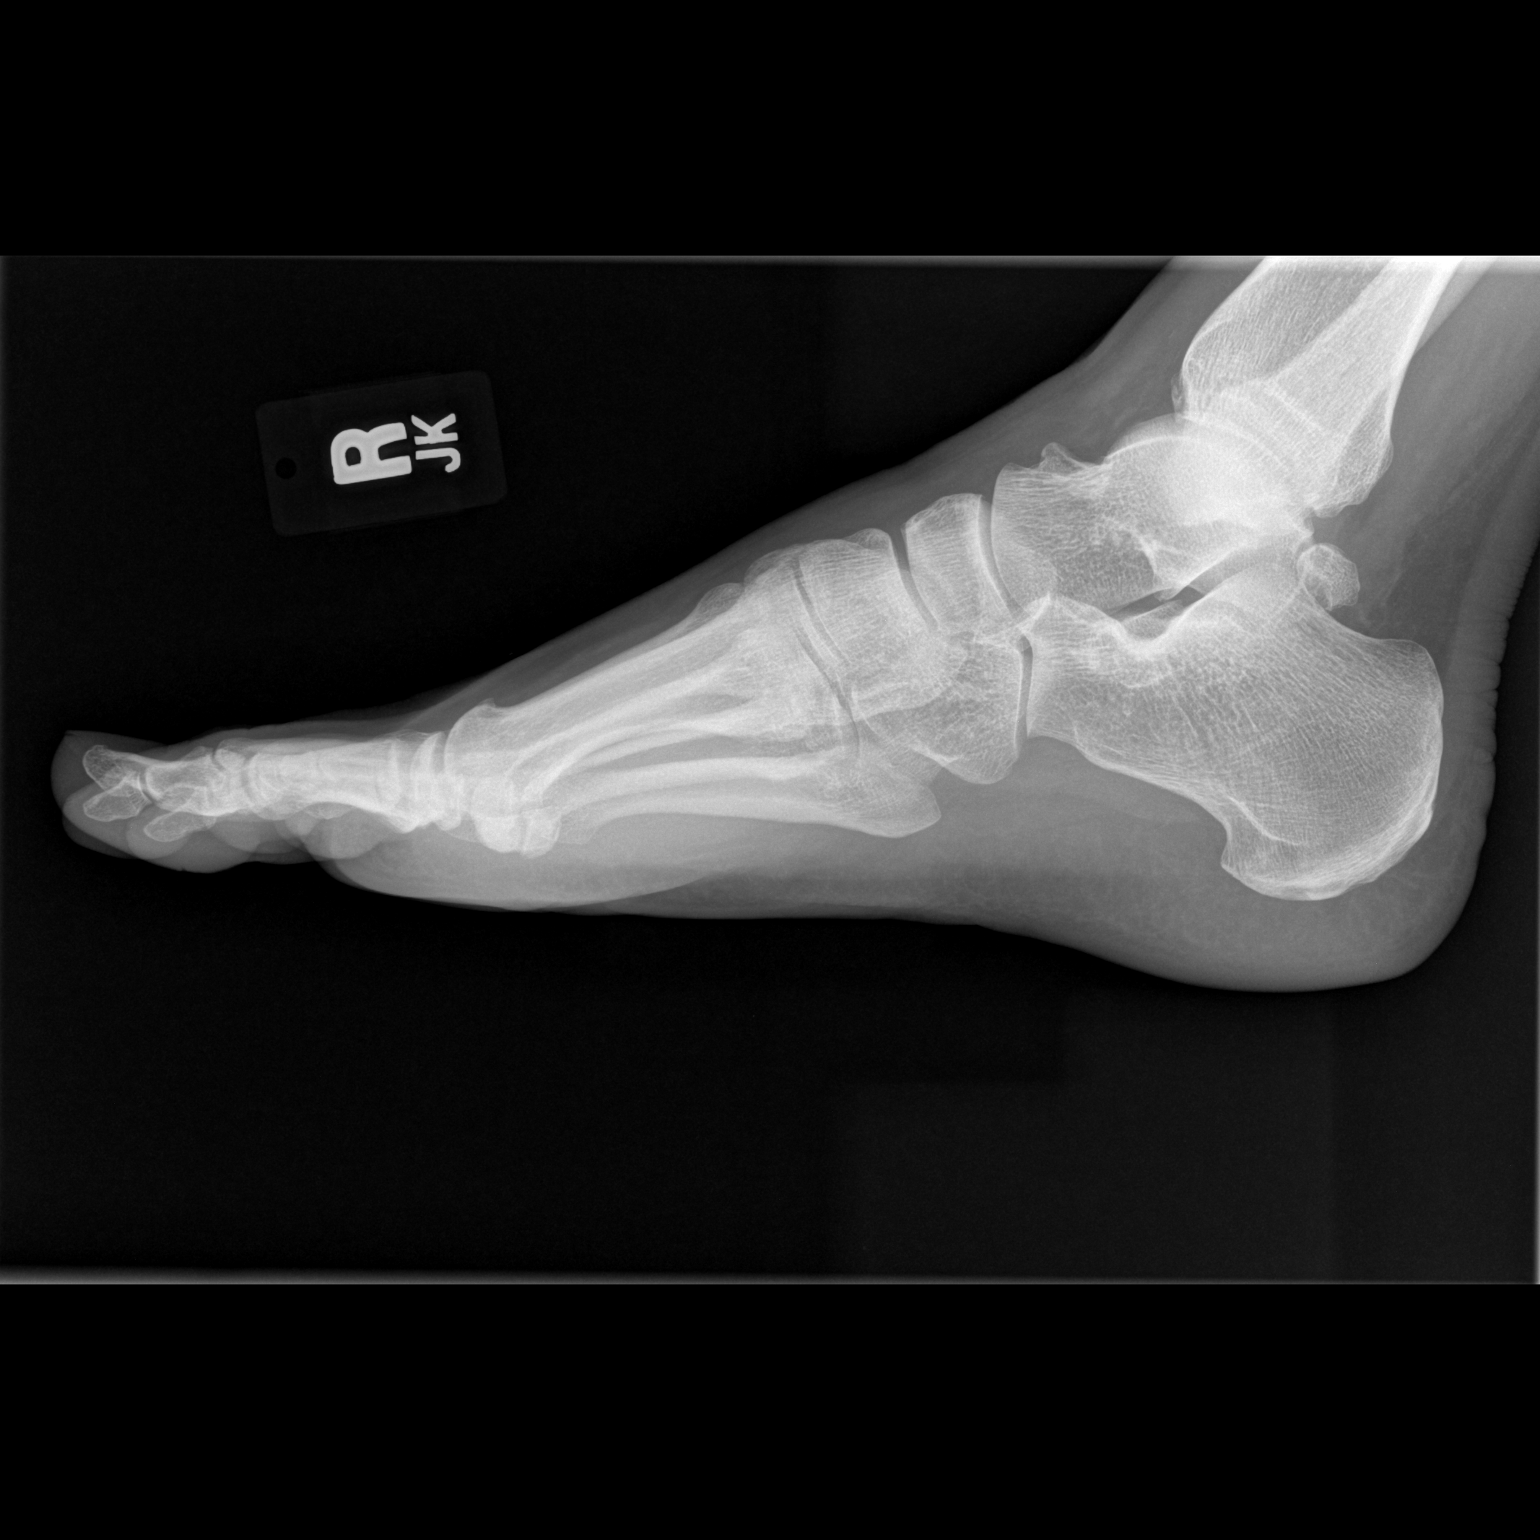

[2 of 2 positions shown; findings below may reference images not displayed]

FINDINGS: Bone mineralization is within normal limits. Calcaneus intact. Mild
degenerative spurring at the anterior talus. Metatarsals appear
intact. Joint spaces and alignment in the right foot are normal for
age. No acute osseous abnormality identified.
IMPRESSION: No acute osseous abnormality identified in the right foot. No
arthropathic changes identified in the right first ray.

## 2016-08-31 IMAGING — CR DG ANKLE 2V *R*
2 series · 2 of 2 positions shown · non-contrast
Comparison: 03/17/2016

CLINICAL DATA: Chronic right foot and ankle pain

EXAM:
RIGHT ANKLE - 2 VIEW

[t ankle joint ap right]
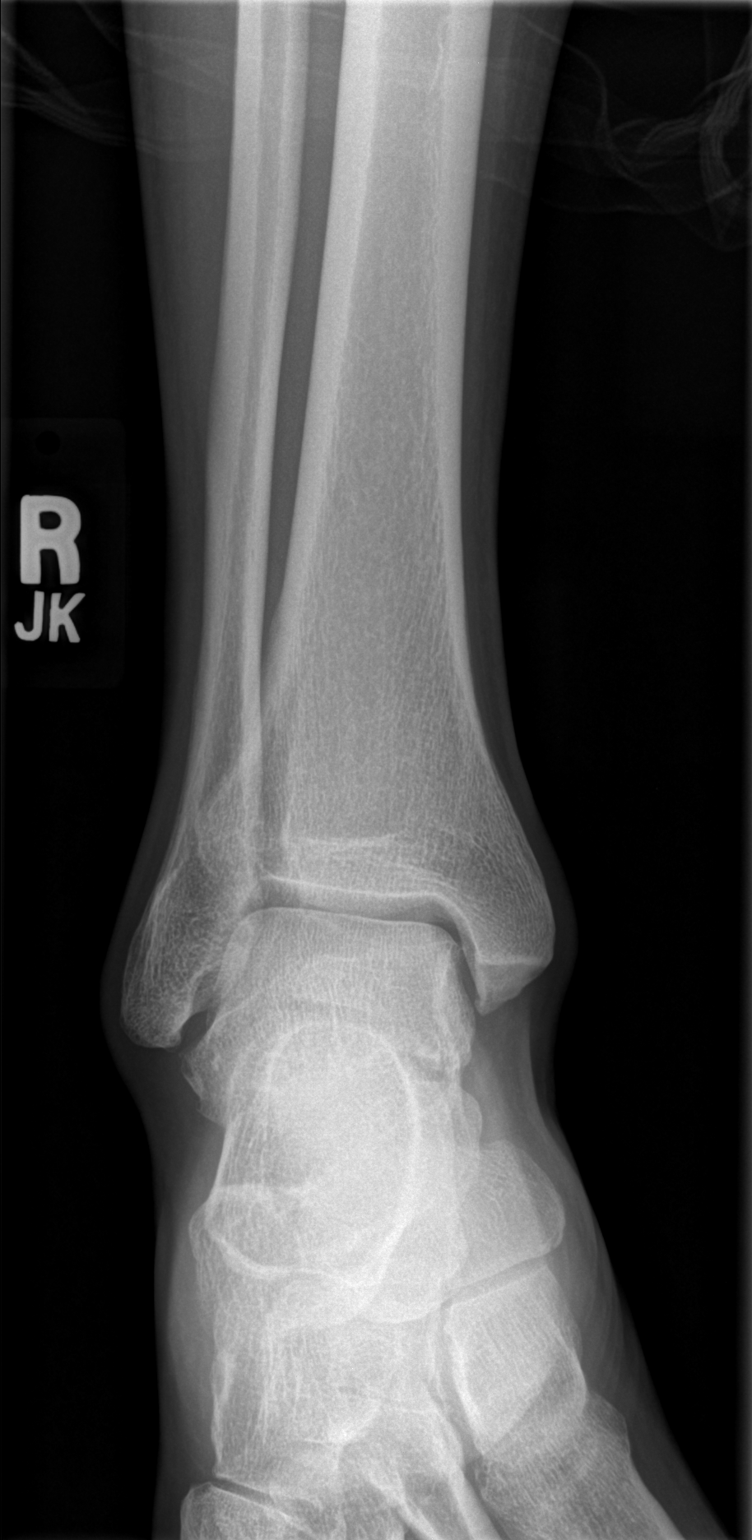

[t ankle joint lat right]
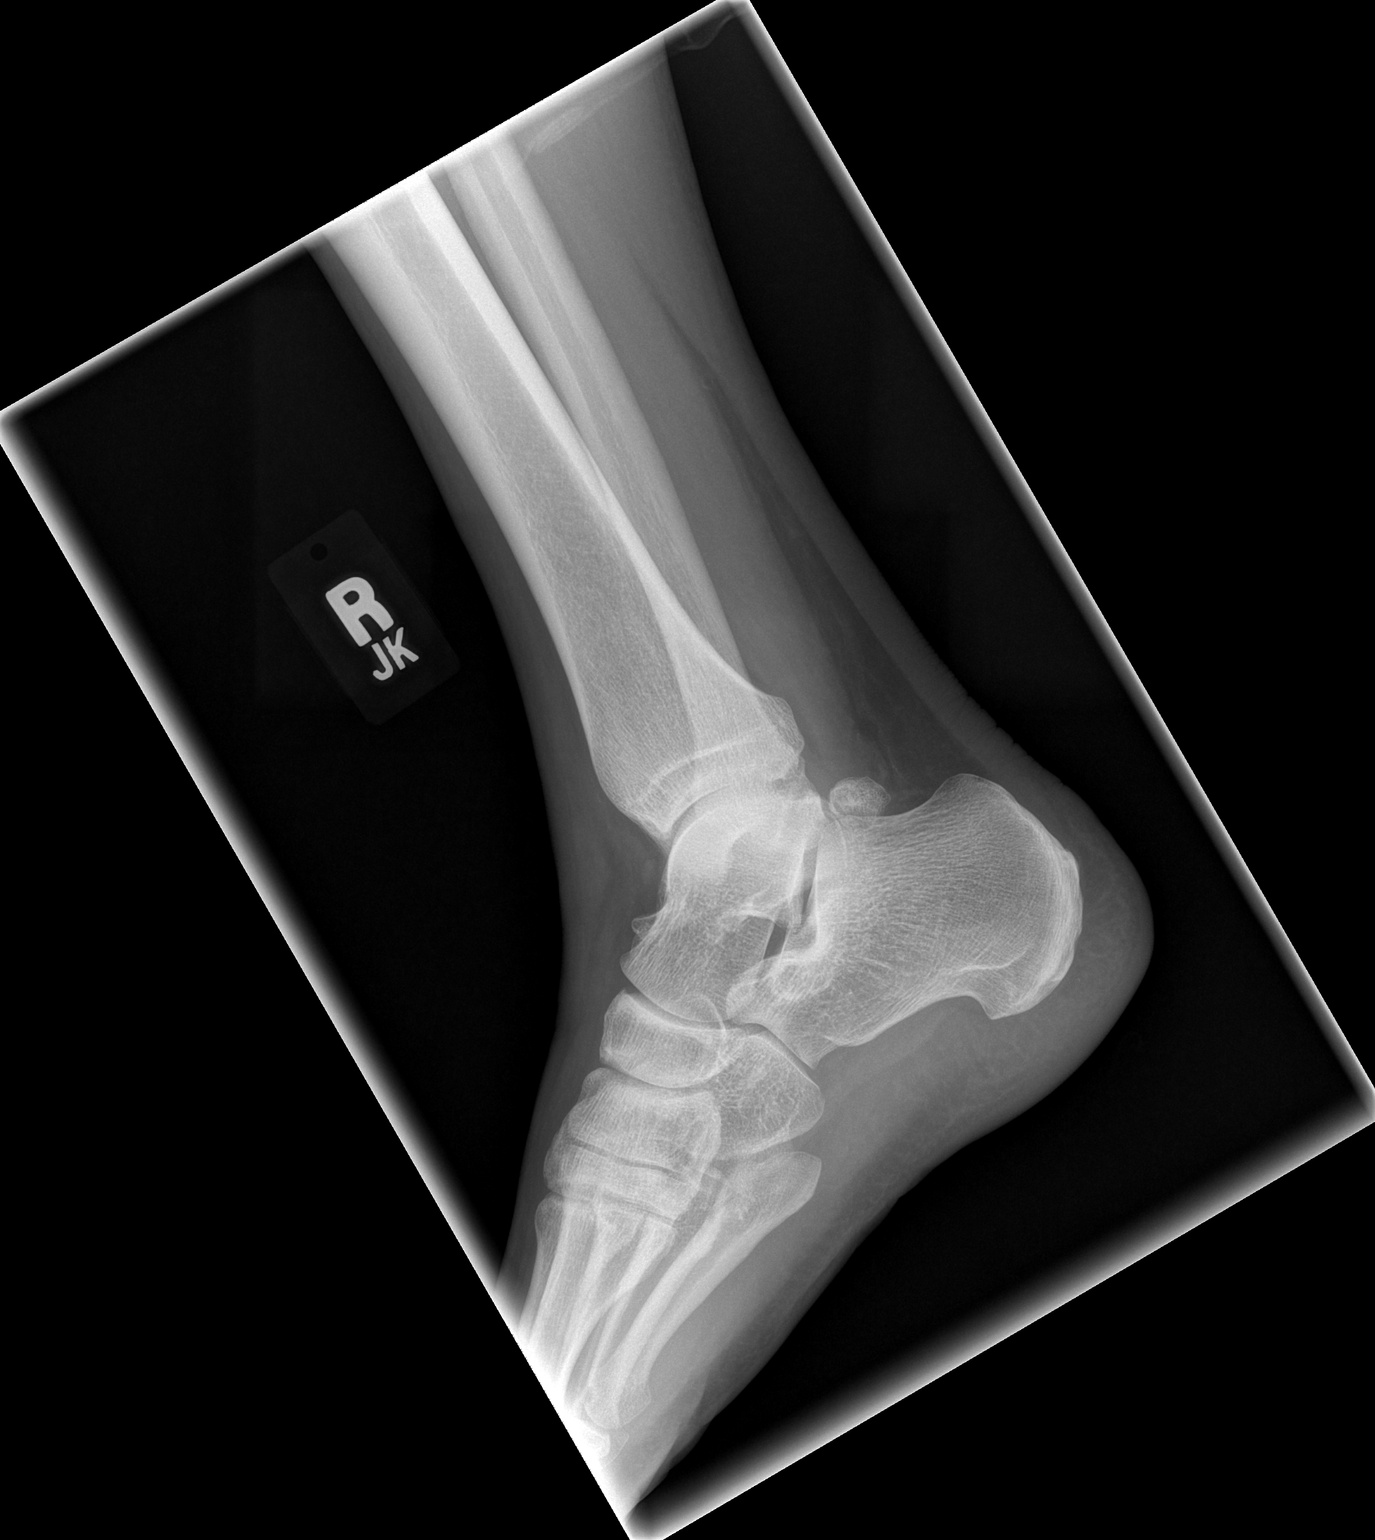

[2 of 2 positions shown; findings below may reference images not displayed]

FINDINGS: Normal alignment without acute osseous finding or fracture. Os
trigonum noted posteriorly. This is a normal variant. Preserved
joint spaces. No significant arthropathy. Right distal tibia, distal
fibula, talus and calcaneus appear intact.
IMPRESSION: No acute finding.
